# Patient Record
Sex: Female | Born: 2005 | Race: Black or African American | Hispanic: No | Marital: Single | Smoking: Never smoker
Health system: Southern US, Community
[De-identification: ages and names within clinical notes are randomized; demographics above are authoritative.]

## PROBLEM LIST (undated history)

## (undated) DIAGNOSIS — J45909 Unspecified asthma, uncomplicated: Secondary | ICD-10-CM

---
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
## 2023-10-01 ENCOUNTER — Other Ambulatory Visit: Payer: Self-pay | Admitting: Medical Genetics

## 2023-10-18 ENCOUNTER — Other Ambulatory Visit
Admission: RE | Admit: 2023-10-18 | Discharge: 2023-10-18 | Disposition: A | Discharge status: Home / Self Care | Payer: Self-pay | Source: Ambulatory Visit | Attending: Medical Genetics | Admitting: Medical Genetics

## 2023-10-28 LAB — GENECONNECT MOLECULAR SCREEN: Genetic Analysis Overall Interpretation: NEGATIVE

## 2024-04-05 ENCOUNTER — Other Ambulatory Visit: Payer: Self-pay

## 2024-04-05 ENCOUNTER — Emergency Department: Admitting: Anesthesiology

## 2024-04-05 ENCOUNTER — Encounter
Admission: EM | Disposition: A | Discharge status: Home / Self Care | Payer: Self-pay | Source: Home / Self Care | Attending: Emergency Medicine

## 2024-04-05 ENCOUNTER — Emergency Department

## 2024-04-05 ENCOUNTER — Ambulatory Visit: Admission: EM | Admit: 2024-04-05 | Discharge: 2024-04-05 | Disposition: A | Discharge status: Home / Self Care

## 2024-04-05 DIAGNOSIS — E66813 Obesity, class 3: Secondary | ICD-10-CM | POA: Diagnosis not present

## 2024-04-05 DIAGNOSIS — Z6833 Body mass index (BMI) 33.0-33.9, adult: Secondary | ICD-10-CM | POA: Insufficient documentation

## 2024-04-05 DIAGNOSIS — D649 Anemia, unspecified: Secondary | ICD-10-CM | POA: Diagnosis not present

## 2024-04-05 DIAGNOSIS — K810 Acute cholecystitis: Secondary | ICD-10-CM | POA: Diagnosis not present

## 2024-04-05 DIAGNOSIS — K8012 Calculus of gallbladder with acute and chronic cholecystitis without obstruction: Secondary | ICD-10-CM | POA: Insufficient documentation

## 2024-04-05 DIAGNOSIS — K819 Cholecystitis, unspecified: Secondary | ICD-10-CM | POA: Diagnosis present

## 2024-04-05 DIAGNOSIS — K8000 Calculus of gallbladder with acute cholecystitis without obstruction: Secondary | ICD-10-CM

## 2024-04-05 LAB — COMPREHENSIVE METABOLIC PANEL WITH GFR
ALT: 25 U/L (ref 0–44)
AST: 40 U/L (ref 15–41)
Albumin: 4.3 g/dL (ref 3.5–5.0)
Alkaline Phosphatase: 136 U/L — ABNORMAL HIGH (ref 38–126)
Anion gap: 12 (ref 5–15)
BUN: 10 mg/dL (ref 6–20)
CO2: 24 mmol/L (ref 22–32)
Calcium: 9.3 mg/dL (ref 8.9–10.3)
Chloride: 105 mmol/L (ref 98–111)
Creatinine, Ser: 0.91 mg/dL (ref 0.44–1.00)
GFR, Estimated: >60 mL/min
Glucose, Bld: 111 mg/dL — ABNORMAL HIGH (ref 70–99)
Potassium: 3.6 mmol/L (ref 3.5–5.1)
Sodium: 140 mmol/L (ref 135–145)
Total Bilirubin: 0.3 mg/dL (ref 0.0–1.2)
Total Protein: 7.7 g/dL (ref 6.5–8.1)

## 2024-04-05 LAB — URINALYSIS, ROUTINE W REFLEX MICROSCOPIC
Bilirubin Urine: NEGATIVE
Glucose, UA: NEGATIVE mg/dL
Hgb urine dipstick: NEGATIVE
Ketones, ur: 5 mg/dL — AB
Leukocytes,Ua: NEGATIVE
Nitrite: NEGATIVE
Protein, ur: NEGATIVE mg/dL
Specific Gravity, Urine: 1.025 (ref 1.005–1.030)
pH: 6 (ref 5.0–8.0)

## 2024-04-05 LAB — POC URINE PREG, ED: Preg Test, Ur: NEGATIVE

## 2024-04-05 LAB — CBC
HCT: 33.9 % — ABNORMAL LOW (ref 36.0–46.0)
Hemoglobin: 10.3 g/dL — ABNORMAL LOW (ref 12.0–15.0)
MCH: 20.9 pg — ABNORMAL LOW (ref 26.0–34.0)
MCHC: 30.4 g/dL (ref 30.0–36.0)
MCV: 68.9 fL — ABNORMAL LOW (ref 80.0–100.0)
Platelets: 459 K/uL — ABNORMAL HIGH (ref 150–400)
RBC: 4.92 MIL/uL (ref 3.87–5.11)
RDW: 19.1 % — ABNORMAL HIGH (ref 11.5–15.5)
WBC: 13.5 K/uL — ABNORMAL HIGH (ref 4.0–10.5)
nRBC: 0 % (ref 0.0–0.2)

## 2024-04-05 LAB — LIPASE, BLOOD: Lipase: 16 U/L (ref 11–51)

## 2024-04-05 MED ORDER — FENTANYL CITRATE (PF) 100 MCG/2ML IJ SOLN
25.0000 ug | INTRAMUSCULAR | Status: DC | PRN
  Administered 2024-04-05 (×4): 25 ug via INTRAVENOUS

## 2024-04-05 MED ORDER — SODIUM CHLORIDE 0.9 % IV BOLUS
1000.0000 mL | Freq: Once | INTRAVENOUS | Status: AC
  Administered 2024-04-05: 1000 mL via INTRAVENOUS

## 2024-04-05 MED ORDER — MIDAZOLAM HCL (PF) 2 MG/2ML IJ SOLN
INTRAMUSCULAR | Status: DC | PRN
  Administered 2024-04-05: 2 mg via INTRAVENOUS

## 2024-04-05 MED ORDER — IRON SUCROSE 300 MG IVPB - SIMPLE MED
300.0000 mg | Freq: Once | Status: AC
  Administered 2024-04-05: 300 mg via INTRAVENOUS
  Filled 2024-04-05: qty 300

## 2024-04-05 MED ORDER — ROCURONIUM BROMIDE 100 MG/10ML IV SOLN
INTRAVENOUS | Status: DC | PRN
  Administered 2024-04-05: 50 mg via INTRAVENOUS
  Administered 2024-04-05: 30 mg via INTRAVENOUS

## 2024-04-05 MED ORDER — KETOROLAC TROMETHAMINE 30 MG/ML IJ SOLN
INTRAMUSCULAR | Status: DC | PRN
  Administered 2024-04-05: 30 mg via INTRAVENOUS

## 2024-04-05 MED ORDER — PROPOFOL 10 MG/ML IV BOLUS
INTRAVENOUS | Status: AC
  Filled 2024-04-05: qty 20

## 2024-04-05 MED ORDER — FENTANYL CITRATE (PF) 50 MCG/ML IJ SOSY
50.0000 ug | PREFILLED_SYRINGE | Freq: Once | INTRAMUSCULAR | Status: AC
  Administered 2024-04-05: 50 ug via INTRAVENOUS
  Filled 2024-04-05: qty 1

## 2024-04-05 MED ORDER — FENTANYL CITRATE (PF) 100 MCG/2ML IJ SOLN
INTRAMUSCULAR | Status: AC
  Filled 2024-04-05: qty 2

## 2024-04-05 MED ORDER — DEXMEDETOMIDINE HCL IN NACL 80 MCG/20ML IV SOLN
INTRAVENOUS | Status: DC | PRN
  Administered 2024-04-05: 8 ug via INTRAVENOUS
  Administered 2024-04-05: 12 ug via INTRAVENOUS

## 2024-04-05 MED ORDER — FENTANYL CITRATE (PF) 100 MCG/2ML IJ SOLN
INTRAMUSCULAR | Status: DC | PRN
  Administered 2024-04-05: 100 ug via INTRAVENOUS

## 2024-04-05 MED ORDER — MIDAZOLAM HCL 2 MG/2ML IJ SOLN
INTRAMUSCULAR | Status: AC
  Filled 2024-04-05: qty 2

## 2024-04-05 MED ORDER — KETOROLAC TROMETHAMINE 30 MG/ML IJ SOLN
INTRAMUSCULAR | Status: AC
  Filled 2024-04-05: qty 1

## 2024-04-05 MED ORDER — CEFAZOLIN SODIUM-DEXTROSE 2-3 GM-%(50ML) IV SOLR
INTRAVENOUS | Status: DC | PRN
  Administered 2024-04-05: 1 g via INTRAVENOUS

## 2024-04-05 MED ORDER — LIDOCAINE HCL (PF) 2 % IJ SOLN
INTRAMUSCULAR | Status: AC
  Filled 2024-04-05: qty 5

## 2024-04-05 MED ORDER — BUPIVACAINE-EPINEPHRINE (PF) 0.25% -1:200000 IJ SOLN
INTRAMUSCULAR | Status: DC | PRN
  Administered 2024-04-05: 30 mL

## 2024-04-05 MED ORDER — OXYCODONE HCL 5 MG PO TABS
5.0000 mg | ORAL_TABLET | Freq: Once | ORAL | Status: AC | PRN
  Administered 2024-04-05: 5 mg via ORAL

## 2024-04-05 MED ORDER — PROPOFOL 500 MG/50ML IV EMUL
INTRAVENOUS | Status: DC | PRN
  Administered 2024-04-05: 150 ug/kg/min via INTRAVENOUS

## 2024-04-05 MED ORDER — PROPOFOL 1000 MG/100ML IV EMUL
INTRAVENOUS | Status: AC
  Filled 2024-04-05: qty 100

## 2024-04-05 MED ORDER — PROPOFOL 10 MG/ML IV BOLUS
INTRAVENOUS | Status: DC | PRN
  Administered 2024-04-05: 150 mg via INTRAVENOUS

## 2024-04-05 MED ORDER — INDOCYANINE GREEN 25 MG IJ SOLR
INTRAMUSCULAR | Status: AC
  Filled 2024-04-05: qty 10

## 2024-04-05 MED ORDER — ONDANSETRON HCL 4 MG/2ML IJ SOLN: 4.0000 mg | Freq: Once | INTRAMUSCULAR | Status: DC | PRN

## 2024-04-05 MED ORDER — DEXMEDETOMIDINE HCL IN NACL 80 MCG/20ML IV SOLN
INTRAVENOUS | Status: AC
  Filled 2024-04-05: qty 20

## 2024-04-05 MED ORDER — ROCURONIUM BROMIDE 10 MG/ML (PF) SYRINGE
PREFILLED_SYRINGE | INTRAVENOUS | Status: AC
  Filled 2024-04-05: qty 10

## 2024-04-05 MED ORDER — HYDROCODONE-ACETAMINOPHEN 5-325 MG PO TABS: 1.0000 | ORAL_TABLET | ORAL | 0 refills | Status: DC | PRN

## 2024-04-05 MED ORDER — HYDROMORPHONE HCL 1 MG/ML IJ SOLN
INTRAMUSCULAR | Status: AC
  Filled 2024-04-05: qty 1

## 2024-04-05 MED ORDER — BUPIVACAINE-EPINEPHRINE (PF) 0.25% -1:200000 IJ SOLN
INTRAMUSCULAR | Status: AC
  Filled 2024-04-05: qty 30

## 2024-04-05 MED ORDER — HYDROCODONE-ACETAMINOPHEN 5-325 MG PO TABS
1.0000 | ORAL_TABLET | ORAL | 0 refills | Status: DC | PRN
  Filled 2024-04-05: qty 25, 3d supply, fill #0

## 2024-04-05 MED ORDER — DEXAMETHASONE SOD PHOSPHATE PF 10 MG/ML IJ SOLN
INTRAMUSCULAR | Status: DC | PRN
  Administered 2024-04-05: 8 mg via INTRAVENOUS

## 2024-04-05 MED ORDER — OXYCODONE HCL 5 MG PO TABS
ORAL_TABLET | ORAL | Status: AC
  Filled 2024-04-05: qty 1

## 2024-04-05 MED ORDER — SUGAMMADEX SODIUM 200 MG/2ML IV SOLN
INTRAVENOUS | Status: DC | PRN
  Administered 2024-04-05: 50 mg via INTRAVENOUS
  Administered 2024-04-05: 200 mg via INTRAVENOUS

## 2024-04-05 MED ORDER — INDOCYANINE GREEN 25 MG IJ SOLR
1.2500 mg | Freq: Once | INTRAMUSCULAR | Status: AC
  Administered 2024-04-05: 1.25 mg via INTRAVENOUS

## 2024-04-05 MED ORDER — OXYCODONE HCL 5 MG/5ML PO SOLN: 5.0000 mg | Freq: Once | ORAL | Status: AC | PRN

## 2024-04-05 MED ORDER — LACTATED RINGERS IV SOLN: INTRAVENOUS | Status: DC | PRN

## 2024-04-05 MED ORDER — LIDOCAINE HCL (CARDIAC) PF 100 MG/5ML IV SOSY
PREFILLED_SYRINGE | INTRAVENOUS | Status: DC | PRN
  Administered 2024-04-05: 100 mg via INTRAVENOUS

## 2024-04-05 MED ORDER — ONDANSETRON HCL 4 MG/2ML IJ SOLN
4.0000 mg | Freq: Once | INTRAMUSCULAR | Status: AC | PRN
  Administered 2024-04-05: 4 mg via INTRAVENOUS
  Filled 2024-04-05: qty 2

## 2024-04-05 MED ORDER — ONDANSETRON HCL 4 MG/2ML IJ SOLN
INTRAMUSCULAR | Status: DC | PRN
  Administered 2024-04-05: 4 mg via INTRAVENOUS

## 2024-04-05 MED ORDER — SODIUM CHLORIDE 0.9 % IV SOLN
1.0000 g | Freq: Once | INTRAVENOUS | Status: AC
  Administered 2024-04-05: 1 g via INTRAVENOUS
  Filled 2024-04-05: qty 10

## 2024-04-05 MED ORDER — 0.9 % SODIUM CHLORIDE (POUR BTL) OPTIME
TOPICAL | Status: DC | PRN
  Administered 2024-04-05: 500 mL

## 2024-04-05 NOTE — Anesthesia Procedure Notes (Signed)
 Procedure Name: Intubation Date/Time: 04/05/2024 11:01 AM  Performed by: Brien Sotero PARAS, CRNAPre-anesthesia Checklist: Patient identified, Patient being monitored, Timeout performed, Emergency Drugs available and Suction available Patient Re-evaluated:Patient Re-evaluated prior to induction Oxygen Delivery Method: Circle system utilized Preoxygenation: Pre-oxygenation with 100% oxygen Induction Type: IV induction Ventilation: Mask ventilation without difficulty Laryngoscope Size: 3 and McGrath Grade View: Grade I Tube type: Oral Tube size: 7.0 mm Number of attempts: 1 Airway Equipment and Method: Stylet Placement Confirmation: ETT inserted through vocal cords under direct vision, positive ETCO2 and breath sounds checked- equal and bilateral Secured at: 21 cm Tube secured with: Tape Dental Injury: Teeth and Oropharynx as per pre-operative assessment

## 2024-04-05 NOTE — Op Note (Signed)
 Robotic assisted laparoscopic Cholecystectomy  Pre-operative Diagnosis: CHOLECYSTITIS  Post-operative Diagnosis: SAME  Procedure:  Robotic assisted laparoscopic Cholecystectomy  Surgeon: Laneta Luna, MD FACS  Anesthesia: Gen. with endotracheal tube  Findings: ACUTE Cholecystitis   Estimated Blood Loss: 5cc       Specimens: Gallbladder           Complications: none   Procedure Details  The patient was seen again in the Holding Room. The benefits, complications, treatment options, and expected outcomes were discussed with the patient. The risks of bleeding, infection, recurrence of symptoms, failure to resolve symptoms, bile duct damage, bile duct leak, retained common bile duct stone, bowel injury, any of which could require further surgery and/or ERCP, stent, or papillotomy were reviewed with the patient. The likelihood of improving the patient's symptoms with return to their baseline status is good.  The patient and/or family concurred with the proposed plan, giving informed consent.  The patient was taken to Operating Room, identified  and the procedure verified as Laparoscopic Cholecystectomy.  A Time Out was held and the above information confirmed.  Prior to the induction of general anesthesia, antibiotic prophylaxis was administered. VTE prophylaxis was in place. General endotracheal anesthesia was then administered and tolerated well. After the induction, the abdomen was prepped with Chloraprep and draped in the sterile fashion. The patient was positioned in the supine position.  Cut down technique was used to enter the abdominal cavity and a Hasson trochar was placed after two vicryl stitches were anchored to the fascia. Pneumoperitoneum was then created with CO2 and tolerated well without any adverse changes in the patient's vital signs.  Three 8-mm ports were placed under direct vision. All skin incisions  were infiltrated with a local anesthetic agent before making the  incision and placing the trocars.   The patient was positioned  in reverse Trendelenburg, robot was brought to the surgical field and docked in the standard fashion.  We made sure all the instrumentation was kept indirect view at all times and that there were no collision between the arms. I scrubbed out and went to the console.  The gallbladder was identified, the fundus grasped and retracted cephalad. Adhesions were lysed bluntly. The infundibulum was grasped and retracted laterally, exposing the peritoneum overlying the triangle of Calot. This was then divided and exposed in a blunt fashion. An extended critical view of the cystic duct and cystic artery was obtained.  The cystic duct was clearly identified and bluntly dissected.   Artery and duct were double clipped and divided. Using ICG cholangiography we visualize the cystic duct and CBD w/o evidence of bile injuries. The gallbladder was taken from the gallbladder fossa in a retrograde fashion with the electrocautery.  Hemostasis was achieved with the electrocautery. nspection of the right upper quadrant was performed. No bleeding, bile duct injury or leak, or bowel injury was noted. Robotic instruments and robotic arms were undocked in the standard fashion.  I scrubbed back in.  The gallbladder was removed and placed in an Endocatch bag.   Pneumoperitoneum was released.  The periumbilical port site was closed with interrumpted 0 Vicryl sutures. 4-0 subcuticular Monocryl was used to close the skin. Dermabond was  applied.  The patient was then extubated and brought to the recovery room in stable condition. Sponge, lap, and needle counts were correct at closure and at the conclusion of the case.               Laneta Luna, MD, FACS

## 2024-04-05 NOTE — ED Triage Notes (Signed)
 Pt to ED for abd pain x 1 month. Reports emesis every other night. Reports pain after eating. Hx GERD

## 2024-04-05 NOTE — Discharge Instructions (Signed)

## 2024-04-05 NOTE — H&P (Signed)
 Patient ID: Jody Galvan, female   DOB: 2005-06-01, 19 y.o.   MRN: [id number]  HPI Jody Galvan is a 19 y.o. female in consultation at the request of Dr. Adine.  She presented with right upper quadrant and epigastric pain that Woke her from the sleep 1 am.  Pain is sharp moderate to severe intensity intermittent. He has been having these intermittent episodes for the last month but worse episode was earlier this morning.  She also reports that she had some nausea and vomiting. Episodes seem to be worsening after heavy meal. No past surgical history No fam hx of biliary disease She does have a history of chronic anemia She did have an ultrasound that I personally reviewed showing evidence of cholelithiasis with thickening of the gallbladder wall and a stone lodged in the neck of the gallbladder consistent with acute cholecystitis.  LFTs were completely normal, DBC shows elevation of the white count to 13.5, hemoglobin of 10.3   HPI  Past Medical History:  Diagnosis Date   Asthma     History reviewed. No pertinent surgical history.  History reviewed. No pertinent family history.  Social History Social History[1]  Allergies[2]  No current facility-administered medications for this encounter.   Current Outpatient Medications  Medication Sig Dispense Refill   ferrous sulfate  325 (65 FE) MG EC tablet Take 1 tablet (325 mg total) by mouth daily with breakfast. 30 tablet 0   medroxyPROGESTERone  (PROVERA ) 10 MG tablet Take 1 tablet (10 mg total) by mouth daily for 10 days. 10 tablet 0     Review of Systems Full ROS  was asked and was negative except for the information on the HPI  Physical Exam Blood pressure 127/77, pulse 61, temperature 98.2 F (36.8 C), resp. rate 18, height 5' 7 (1.702 m), weight 97.1 kg, last menstrual period 03/03/2024, SpO2 100%. CONSTITUTIONAL: NAD. EYES: Pupils are equal, round, Sclera are non-icteric. EARS, NOSE, MOUTH AND THROAT: The oropharynx  is clear. The oral mucosa is pink and moist. Hearing is intact to voice. LYMPH NODES:  Lymph nodes in the neck are normal. RESPIRATORY:  Lungs are clear. There is normal respiratory effort, with equal breath sounds bilaterally, and without pathologic use of accessory muscles. CARDIOVASCULAR: Heart is regular without murmurs, gallops, or rubs. GI: The abdomen is  soft, + Murphy sign no overt peritonitis  There are no palpable masses. There is no hepatosplenomegaly. There are normal bowel sounds  GU: Rectal deferred.   MUSCULOSKELETAL: Normal muscle strength and tone. No cyanosis or edema.   SKIN: Turgor is good and there are no pathologic skin lesions or ulcers. NEUROLOGIC: Motor and sensation is grossly normal. Cranial nerves are grossly intact. PSYCH:  Oriented to person, place and time. Affect is normal.  Data Reviewed I have personally reviewed the patient's imaging, laboratory findings and medical records.    Assessment/Plan 19 yo very pleasant female w signs and sxs c/w Acute cholecystitis . We will start crystalloids, antibiotics and post her for prompt cholecystectomy today The risks, benefits, complications, treatment options, and expected outcomes were discussed with the patient. The possibilities of bleeding, recurrent infection, finding a normal gallbladder, perforation of viscus organs, damage to surrounding structures, bile leak, abscess formation, needing a drain placed, the need for additional procedures, reaction to medication, pulmonary aspiration,  failure to diagnose a condition, the possible need to convert to an open procedure, and creating a complication requiring transfusion or operation were discussed with the patient. The patient and/or family concurred  with the proposed plan, giving informed consent.  I personally spent a total of 75 minutes in the care of the patient today including performing a medically appropriate exam/evaluation, counseling and educating, placing  orders, referring and communicating with other health care professionals, documenting clinical information in the EHR, independently interpreting and reviewing images studies and coordinating care.    Laneta Luna, MD FACS General Surgeon 04/05/2024, 8:26 AM      [1]  Social History Tobacco Use   Smoking status: Never   Smokeless tobacco: Never  Substance Use Topics   Alcohol use: Not Currently   Drug use: Not Currently  [2] No Known Allergies

## 2024-04-05 NOTE — Anesthesia Postprocedure Evaluation (Signed)
"   Anesthesia Post Note  Patient: Jody Galvan  Procedure(s) Performed: CHOLECYSTECTOMY, ROBOT-ASSISTED, LAPAROSCOPIC (Abdomen)  Patient location during evaluation: PACU Anesthesia Type: General Level of consciousness: awake Pain management: pain level controlled Vital Signs Assessment: post-procedure vital signs reviewed and stable Respiratory status: spontaneous breathing Cardiovascular status: stable Anesthetic complications: no   No notable events documented.   Last Vitals:  Vitals:   04/05/24 1255 04/05/24 1300  BP:  115/74  Pulse: 60 64  Resp: 20 17  Temp:    SpO2: 99% 100%    Last Pain:  Vitals:   04/05/24 1300  TempSrc:   PainSc: 6                  VAN STAVEREN,[name]      "

## 2024-04-05 NOTE — ED Provider Notes (Signed)
 "  [facility] Provider Note   Event Date/Time   First MD Initiated Contact with Patient 04/05/24 [phone]     (approximate) History  Abdominal Pain  HPI Jody Galvan is a 19 y.o. female with stated past medical history of asthma who presents for intermittent abdominal pain over the last month.  Patient states that this pain is worse in the right upper quadrant and postprandially.  Patient denies any other exacerbating or relieving factors.  Patient states that the abdominal pain is worse after every successive meal during the day to the point where she is unable to sleep.  This is associated with nausea and vomiting over the last few weeks as well.  Patient denies any recent travel, sick contacts, or food at the ordinary ROS: Patient currently denies any vision changes, tinnitus, difficulty speaking, facial droop, sore throat, chest pain, shortness of breath, diarrhea, dysuria, or weakness/numbness/paresthesias in any extremity   Physical Exam  Triage Vital Signs: ED Triage Vitals  Encounter Vitals Group     BP 04/05/24 0450 127/77     Girls Systolic BP Percentile --      Girls Diastolic BP Percentile --      Boys Systolic BP Percentile --      Boys Diastolic BP Percentile --      Pulse Rate 04/05/24 0450 61     Resp 04/05/24 0450 18     Temp 04/05/24 0450 98.2 F (36.8 C)     Temp src --      SpO2 04/05/24 0450 100 %     Weight 04/05/24 0451 214 lb (97.1 kg)     Height 04/05/24 0451 5' 7 (1.702 m)     Head Circumference --      Peak Flow --      Pain Score 04/05/24 0451 9     Pain Loc --      Pain Education --      Exclude from Growth Chart --    Most recent vital signs: Vitals:   04/05/24 1321 04/05/24 1332  BP:  (!) 111/58  Pulse: (!) 55 74  Resp: 16 16  Temp:  98.7 F (37.1 C)  SpO2: 96% 100%   General: Awake, oriented x4. CV:  Good peripheral perfusion. Resp:  Normal effort. Abd:  No distention.  Right upper quadrant tenderness to  palpation Other:  Young adult obese African-American female resting comfortably in no acute distress ED Results / Procedures / Treatments  Labs (all labs ordered are listed, but only abnormal results are displayed) Labs Reviewed  COMPREHENSIVE METABOLIC PANEL WITH GFR - Abnormal; Notable for the following components:      Result Value   Glucose, Bld 111 (*)    Alkaline Phosphatase 136 (*)    All other components within normal limits  CBC - Abnormal; Notable for the following components:   WBC 13.5 (*)    Hemoglobin 10.3 (*)    HCT 33.9 (*)    MCV 68.9 (*)    MCH 20.9 (*)    RDW 19.1 (*)    Platelets 459 (*)    All other components within normal limits  URINALYSIS, ROUTINE W REFLEX MICROSCOPIC - Abnormal; Notable for the following components:   Color, Urine YELLOW (*)    APPearance HAZY (*)    Ketones, ur 5 (*)    All other components within normal limits  LIPASE, BLOOD  POC URINE PREG, ED  SURGICAL PATHOLOGY   RADIOLOGY ED MD interpretation:  Right upper quadrant ultrasound shows 12 mm stone stuck in the gallbladder neck with mild gallbladder wall thickening to 3.4 mm - All radiology independently interpreted and agree with radiology assessment Official radiology report(s): No results found. PROCEDURES: Critical Care performed: Yes, see critical care procedure note(s) Procedures MEDICATIONS ORDERED IN ED: Medications  fentaNYL  (SUBLIMAZE ) injection 50 mcg (50 mcg Intravenous Given 04/05/24 0520)  ondansetron  (ZOFRAN ) injection 4 mg (4 mg Intravenous Given 04/05/24 0520)  cefTRIAXone  (ROCEPHIN ) 1 g in sodium chloride  0.9 % 100 mL IVPB (0 g Intravenous Stopped 04/05/24 0751)  sodium chloride  0.9 % bolus 1,000 mL (1,000 mLs Intravenous New Bag/Given 04/05/24 0721)  indocyanine green  (IC-GREEN ) injection 1.25 mg (1.25 mg Intravenous Given 04/05/24 0959)  iron  sucrose (VENOFER ) 300 mg in sodium chloride  0.9 % 250 mL IVPB (300 mg Intravenous New Bag/Given 04/05/24 1018)  oxyCODONE   (Oxy IR/ROXICODONE ) immediate release tablet 5 mg (5 mg Oral Given 04/05/24 1308)    Or  oxyCODONE  (ROXICODONE ) 5 MG/5ML solution 5 mg ( Oral See Alternative 04/05/24 1308)   IMPRESSION / MDM / ASSESSMENT AND PLAN / ED COURSE  I reviewed the triage vital signs and the nursing notes.                             The patient is on the cardiac monitor to evaluate for evidence of arrhythmia and/or significant heart rate changes. Patient's presentation is most consistent with acute presentation with potential threat to life or bodily function. Patient is a 19 year old female who presents for intermittent right upper quadrant abdominal pain over the last month DDx: Biliary disease, GERD, gastritis, gastroenteritis, duodenal/peptic ulcer Plan: CBC, CMP, lipase, UA, urine pregnancy, right upper quadrant ultrasound  Patient's right upper quadrant ultrasound showing signs of early Coley cystitis.  I spoken to Dr. Harlen who will be admitting this patient for cholecystectomy  Dispo: Admit to surgery   FINAL CLINICAL IMPRESSION(S) / ED DIAGNOSES   Final diagnoses:  Acute cholecystitis   Rx / DC Orders   ED Discharge Orders          Ordered    Increase activity slowly        04/05/24 1244    Discharge instructions       Comments: SHOWER 48 HRS   04/05/24 1244    Lifting restrictions       Comments: 20 LBS X 6 WKS   04/05/24 1244    Call MD for:  extreme fatigue        04/05/24 1244    Call MD for:  persistant dizziness or light-headedness        04/05/24 1244    Call MD for:  hives        04/05/24 1244    Call MD for:  difficulty breathing, headache or visual disturbances        04/05/24 1244    Call MD for:  redness, tenderness, or signs of infection (pain, swelling, redness, odor or green/yellow discharge around incision site)        04/05/24 1244    Call MD for:  severe uncontrolled pain        04/05/24 1244    Call MD for:  persistant nausea and vomiting        04/05/24 1244     Call MD for:  temperature >100.4        04/05/24 1244    HYDROcodone -acetaminophen  (NORCO/VICODIN) 5-325 MG tablet  Every  4 hours PRN        04/05/24 1244           Note:  This document was prepared using Dragon voice recognition software and may include unintentional dictation errors.   Quenton Recendez K, MD 04/06/24 [phone]  "

## 2024-04-05 NOTE — Anesthesia Preprocedure Evaluation (Signed)
"                                    Anesthesia Evaluation  Patient identified by MRN, date of birth, ID band Patient awake    Reviewed: Allergy & Precautions, NPO status , Patient's Chart, lab work & pertinent test results  Airway Mallampati: III  TM Distance: >3 FB Neck ROM: full    Dental  (+) Teeth Intact   Pulmonary neg pulmonary ROS, asthma    Pulmonary exam normal breath sounds clear to auscultation       Cardiovascular Exercise Tolerance: Good negative cardio ROS Normal cardiovascular exam Rhythm:Regular     Neuro/Psych negative neurological ROS  negative psych ROS   GI/Hepatic negative GI ROS, Neg liver ROS,,,  Endo/Other  negative endocrine ROS  Class 3 obesity  Renal/GU negative Renal ROS  negative genitourinary   Musculoskeletal   Abdominal  (+) + obese  Peds  Hematology negative hematology ROS (+)   Anesthesia Other Findings Past Medical History: No date: Asthma  History reviewed. No pertinent surgical history.  BMI    Body Mass Index: 33.52 kg/m      Reproductive/Obstetrics negative OB ROS                              Anesthesia Physical Anesthesia Plan  ASA: 2  Anesthesia Plan: General   Post-op Pain Management:    Induction: Intravenous  PONV Risk Score and Plan: Ondansetron , Dexamethasone , Midazolam  and Treatment may vary due to age or medical condition  Airway Management Planned: Oral ETT  Additional Equipment:   Intra-op Plan:   Post-operative Plan: Extubation in OR  Informed Consent: I have reviewed the patients History and Physical, chart, labs and discussed the procedure including the risks, benefits and alternatives for the proposed anesthesia with the patient or authorized representative who has indicated his/her understanding and acceptance.     Dental Advisory Given  Plan Discussed with: CRNA  Anesthesia Plan Comments:         Anesthesia Quick Evaluation  "

## 2024-04-05 NOTE — Transfer of Care (Signed)
 Immediate Anesthesia Transfer of Care Note  Patient: Cricket LITTIE Drones  Procedure(s) Performed: CHOLECYSTECTOMY, ROBOT-ASSISTED, LAPAROSCOPIC (Abdomen)  Patient Location: PACU  Anesthesia Type:General  Level of Consciousness: sedated  Airway & Oxygen Therapy: Patient Spontanous Breathing and Patient connected to face mask oxygen  Post-op Assessment: Report given to RN and Post -op Vital signs reviewed and stable  Post vital signs: Reviewed and stable  Last Vitals:  Vitals Value Taken Time  BP 122/65 04/05/24 12:21  Temp 36.5 C 04/05/24 12:21  Pulse 109 04/05/24 12:23  Resp 30 04/05/24 12:23  SpO2 100 % 04/05/24 12:23  Vitals shown include unfiled device data.  Last Pain:  Vitals:   04/05/24 1004  TempSrc: Temporal  PainSc: 0-No pain      Patients Stated Pain Goal: 0 (04/05/24 1004)  Complications: No notable events documented.

## 2024-04-07 LAB — SURGICAL PATHOLOGY

## 2024-04-19 ENCOUNTER — Encounter: Payer: Self-pay | Admitting: Surgery

## 2024-04-21 ENCOUNTER — Ambulatory Visit: Payer: Self-pay | Admitting: Surgery

## 2024-04-21 ENCOUNTER — Encounter: Payer: Self-pay | Admitting: Surgery

## 2024-04-21 VITALS — BP 103/66 | HR 99 | Temp 99.0°F | Ht 67.0 in | Wt 209.2 lb

## 2024-04-21 DIAGNOSIS — K810 Acute cholecystitis: Secondary | ICD-10-CM

## 2024-04-21 DIAGNOSIS — Z09 Encounter for follow-up examination after completed treatment for conditions other than malignant neoplasm: Secondary | ICD-10-CM

## 2024-04-21 NOTE — Progress Notes (Signed)
 Outpatient Surgical Follow Up  04/21/2024  Jody Galvan is an 19 y.o. female.   Chief Complaint  Patient presents with   Routine Post Op    Lap chole 04/05/2024    HPI: s/p chole 3 weeks ago, doing well, tolerating po, no fevers or chills, minimal discomofrt  Past Medical History:  Diagnosis Date   Asthma     History reviewed. No pertinent surgical history.  History reviewed. No pertinent family history.  Social History:  reports that she has never smoked. She has never used smokeless tobacco. She reports that she does not currently use alcohol. She reports that she does not currently use drugs.  Allergies: Allergies[1]  Medications reviewed.    ROS Full ROS performed and is otherwise negative other than what is stated in HPI   BP 103/66   Pulse 99   Temp 99 F (37.2 C) (Oral)   Ht 5' 7 (1.702 m)   Wt 209 lb 3.2 oz (94.9 kg)   LMP 03/03/2024   SpO2 98%   BMI 32.77 kg/m   Physical Exam Alert Abd: soft, nt, no rebound  Assessment/Plan: Doing well w/o complication RTc prn   Laneta Luna, MD FACS General Surgeon    [1] No Known Allergies

## 2024-04-21 NOTE — Patient Instructions (Signed)
